# Patient Record
Sex: Male | Born: 1970 | Race: White | Hispanic: No | Marital: Married | Smoking: Never smoker
Health system: Southern US, Community
[De-identification: ages and names within clinical notes are randomized; demographics above are authoritative.]

## PROBLEM LIST (undated history)

## (undated) DIAGNOSIS — M109 Gout, unspecified: Secondary | ICD-10-CM

## (undated) HISTORY — PX: WRIST SURGERY: SHX841

---

## 2015-10-14 ENCOUNTER — Emergency Department (HOSPITAL_BASED_OUTPATIENT_CLINIC_OR_DEPARTMENT_OTHER)
Admission: EM | Admit: 2015-10-14 | Discharge: 2015-10-14 | Disposition: A | Discharge status: Home / Self Care | Payer: No Typology Code available for payment source | Attending: Emergency Medicine | Admitting: Emergency Medicine

## 2015-10-14 ENCOUNTER — Emergency Department (HOSPITAL_BASED_OUTPATIENT_CLINIC_OR_DEPARTMENT_OTHER): Payer: No Typology Code available for payment source

## 2015-10-14 ENCOUNTER — Encounter (HOSPITAL_BASED_OUTPATIENT_CLINIC_OR_DEPARTMENT_OTHER): Payer: Self-pay | Admitting: Emergency Medicine

## 2015-10-14 DIAGNOSIS — Y999 Unspecified external cause status: Secondary | ICD-10-CM | POA: Insufficient documentation

## 2015-10-14 DIAGNOSIS — M542 Cervicalgia: Secondary | ICD-10-CM | POA: Insufficient documentation

## 2015-10-14 DIAGNOSIS — M549 Dorsalgia, unspecified: Secondary | ICD-10-CM | POA: Diagnosis present

## 2015-10-14 DIAGNOSIS — Y9389 Activity, other specified: Secondary | ICD-10-CM | POA: Diagnosis not present

## 2015-10-14 DIAGNOSIS — Y92410 Unspecified street and highway as the place of occurrence of the external cause: Secondary | ICD-10-CM | POA: Diagnosis not present

## 2015-10-14 MED ORDER — IBUPROFEN 400 MG PO TABS
400.0000 mg | ORAL_TABLET | Freq: Once | ORAL | Status: AC
  Administered 2015-10-14: 400 mg via ORAL
  Filled 2015-10-14: qty 1

## 2015-10-14 MED ORDER — CYCLOBENZAPRINE HCL 7.5 MG PO TABS: 7.5000 mg | ORAL_TABLET | Freq: Three times a day (TID) | ORAL | 0 refills | Status: AC | PRN

## 2015-10-14 NOTE — ED Notes (Signed)
Patient transported to X-ray 
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
## 2015-10-14 NOTE — Discharge Instructions (Signed)
Begin taking ibuprofen 600-800 mg every 6 hours for the next 5 days, whether you are having pain or not.  You can also take Flexeril up to every 8 hours as needed. This medication may make you sleepy.  You can also use heating pads or ice packs on any painful areas.   You pain will probably get worse over the next 2 days, but should begin to improve after that.  It is important to stay active, as this will help your pain go away faster.
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
## 2016-06-03 ENCOUNTER — Ambulatory Visit
Admission: RE | Admit: 2016-06-03 | Discharge: 2016-06-03 | Disposition: A | Discharge status: Home / Self Care | Payer: BLUE CROSS/BLUE SHIELD | Source: Ambulatory Visit | Attending: Allergy | Admitting: Allergy

## 2016-06-03 ENCOUNTER — Other Ambulatory Visit: Payer: Self-pay | Admitting: Allergy

## 2016-06-03 DIAGNOSIS — R059 Cough, unspecified: Secondary | ICD-10-CM

## 2016-06-03 DIAGNOSIS — R05 Cough: Secondary | ICD-10-CM

## 2016-06-08 ENCOUNTER — Other Ambulatory Visit: Payer: Self-pay | Admitting: Acute Care
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
## 2018-03-20 IMAGING — DX DG CHEST 2V
2 series · 2 of 2 positions shown · non-contrast
Comparison: None.

CLINICAL DATA: Prolonged cough and shortness of breath for 1 month.

EXAM:
CHEST  2 VIEW

[dg chest 2 view (1 of 2)]
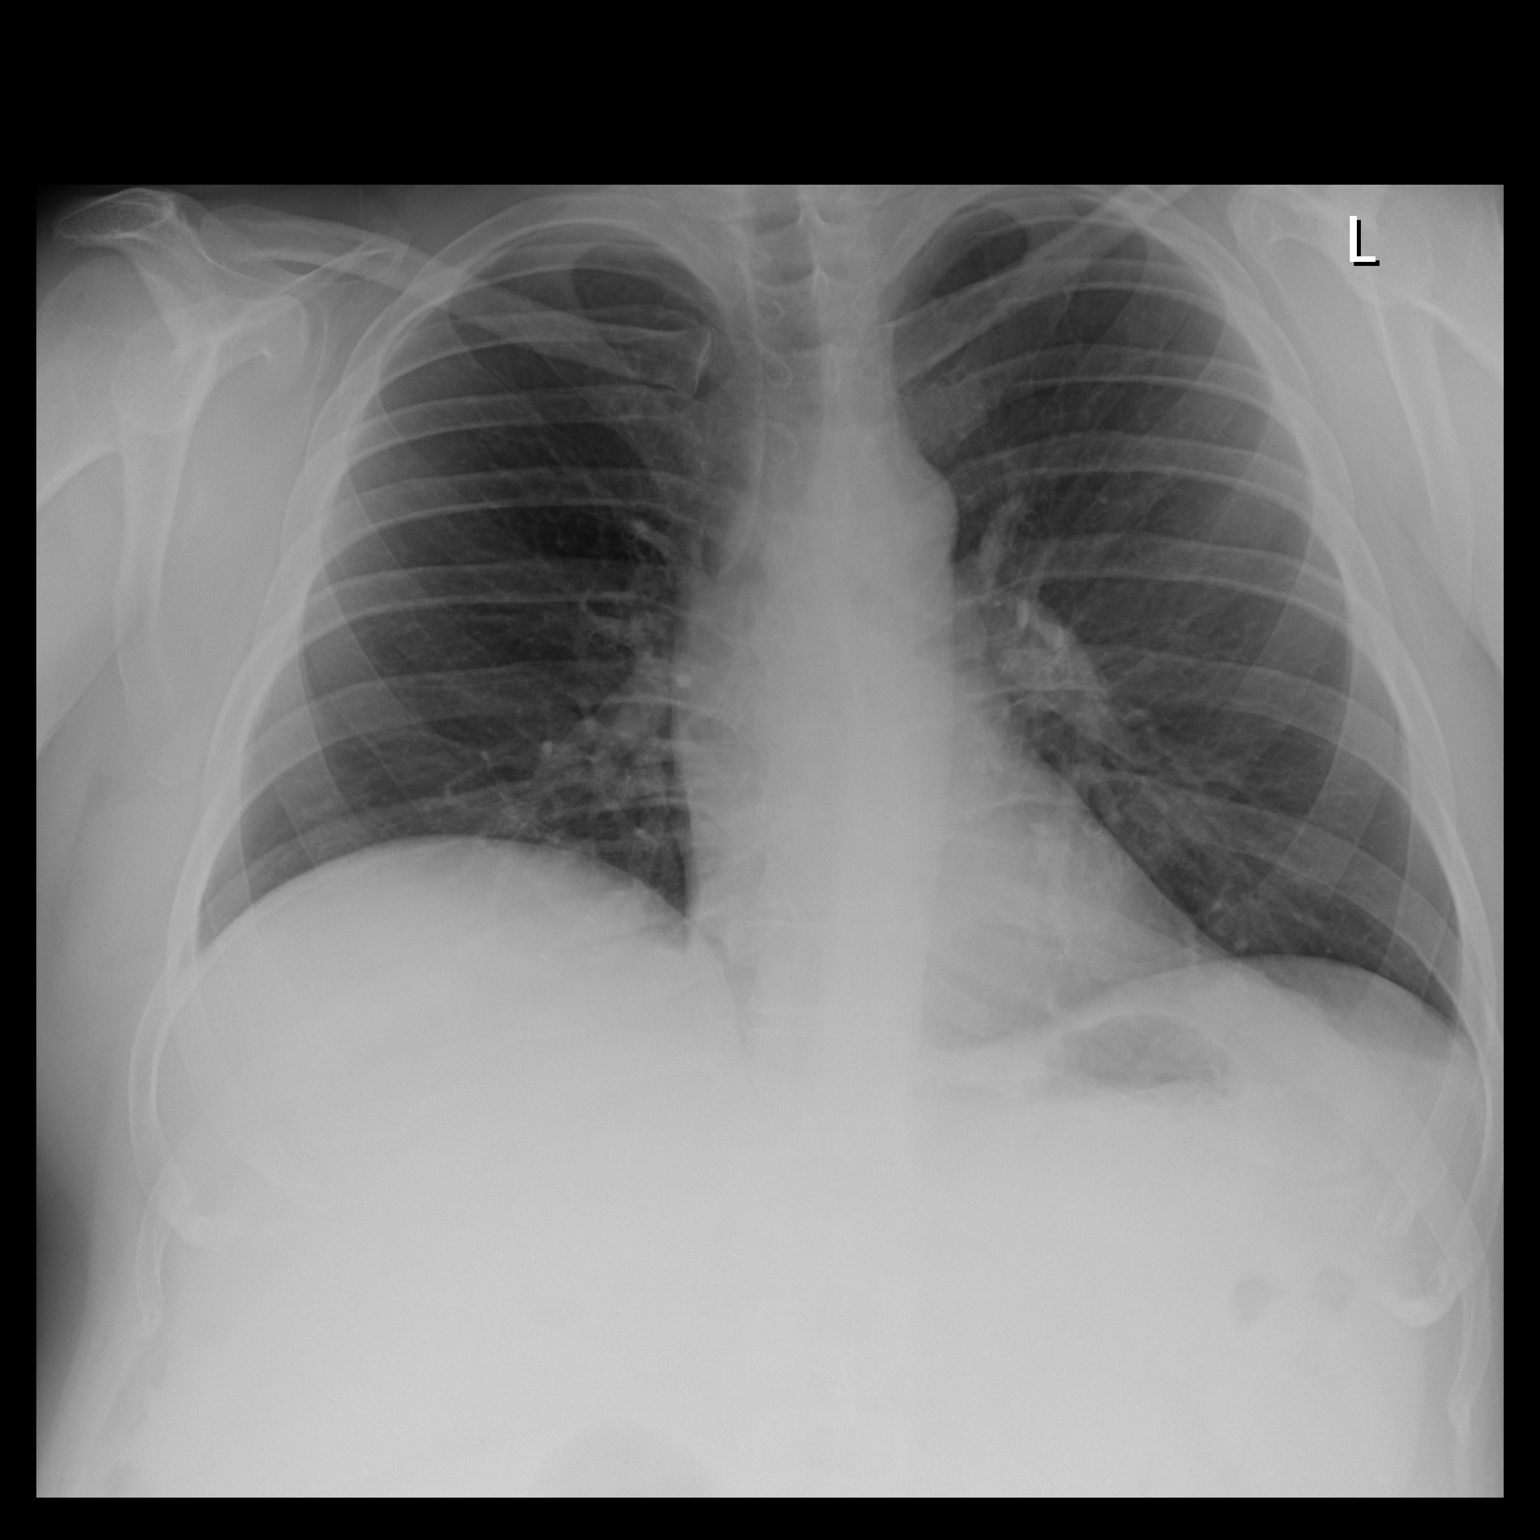

[dg chest 2 view (2 of 2)]
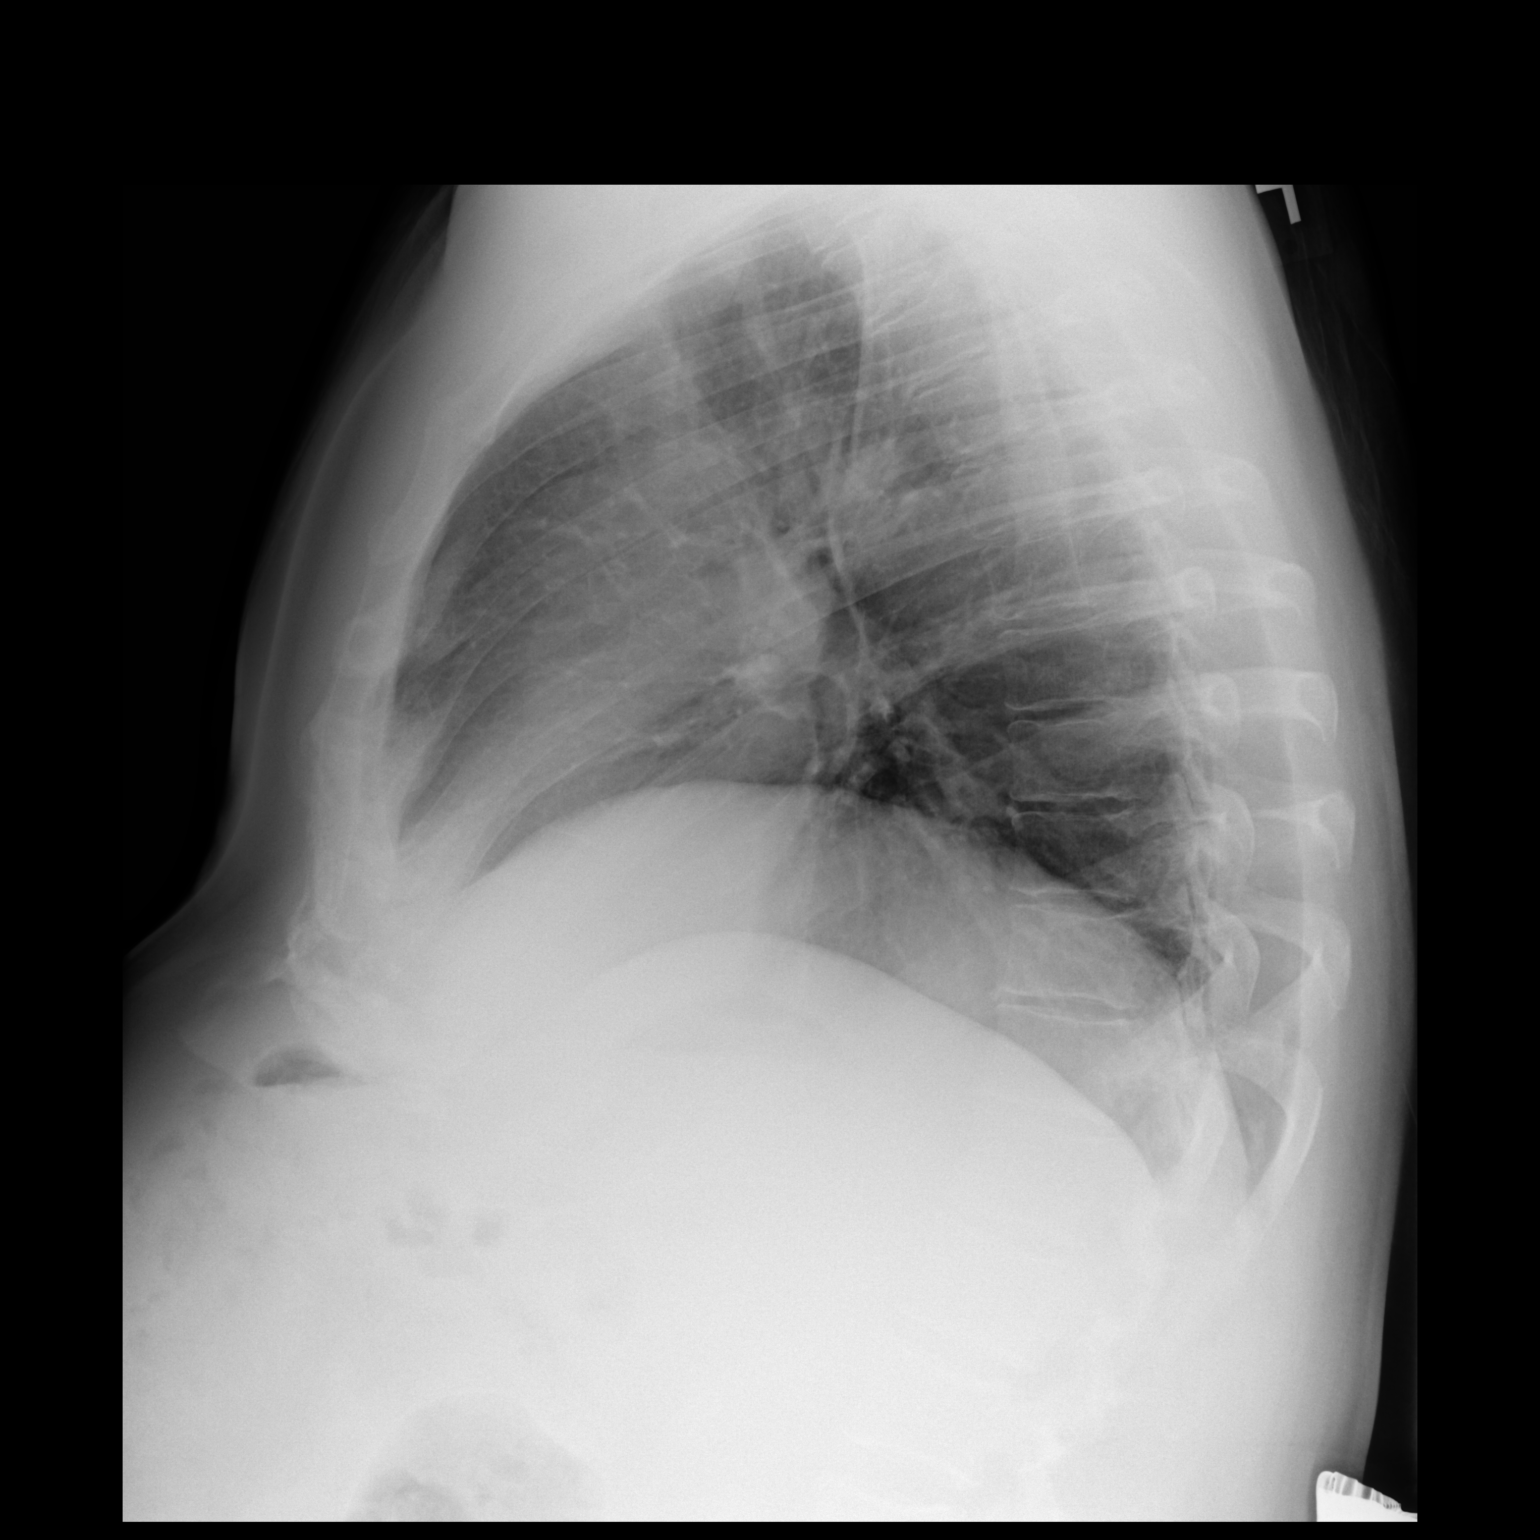

[2 of 2 positions shown; findings below may reference images not displayed]

FINDINGS: The heart size and mediastinal contours are within normal limits.
Both lungs are clear. The visualized skeletal structures are
unremarkable.
IMPRESSION: No active cardiopulmonary disease.

## 2019-02-14 ENCOUNTER — Ambulatory Visit: Payer: BLUE CROSS/BLUE SHIELD | Admitting: Allergy and Immunology

## 2020-09-13 ENCOUNTER — Encounter (HOSPITAL_BASED_OUTPATIENT_CLINIC_OR_DEPARTMENT_OTHER): Payer: Self-pay | Admitting: Emergency Medicine

## 2020-09-13 ENCOUNTER — Other Ambulatory Visit: Payer: Self-pay

## 2020-09-13 ENCOUNTER — Emergency Department (HOSPITAL_BASED_OUTPATIENT_CLINIC_OR_DEPARTMENT_OTHER)
Admission: EM | Admit: 2020-09-13 | Discharge: 2020-09-13 | Disposition: A | Discharge status: Home / Self Care | Payer: BC Managed Care – PPO | Attending: Emergency Medicine | Admitting: Emergency Medicine

## 2020-09-13 ENCOUNTER — Emergency Department (HOSPITAL_BASED_OUTPATIENT_CLINIC_OR_DEPARTMENT_OTHER): Payer: BC Managed Care – PPO

## 2020-09-13 DIAGNOSIS — M25512 Pain in left shoulder: Secondary | ICD-10-CM | POA: Diagnosis not present

## 2020-09-13 DIAGNOSIS — R030 Elevated blood-pressure reading, without diagnosis of hypertension: Secondary | ICD-10-CM

## 2020-09-13 HISTORY — DX: Gout, unspecified: M10.9

## 2020-09-13 MED ORDER — METHOCARBAMOL 750 MG PO TABS: 750.0000 mg | ORAL_TABLET | Freq: Three times a day (TID) | ORAL | 0 refills | Status: AC | PRN

## 2020-09-13 MED ORDER — ACETAMINOPHEN 500 MG PO TABS
1000.0000 mg | ORAL_TABLET | Freq: Once | ORAL | Status: AC
  Administered 2020-09-13: 1000 mg via ORAL
  Filled 2020-09-13: qty 2

## 2020-09-13 MED ORDER — IBUPROFEN 600 MG PO TABS: 600.0000 mg | ORAL_TABLET | Freq: Three times a day (TID) | ORAL | 0 refills | Status: AC | PRN

## 2020-09-13 MED ORDER — METHOCARBAMOL 500 MG PO TABS
750.0000 mg | ORAL_TABLET | Freq: Once | ORAL | Status: AC
  Administered 2020-09-13: 750 mg via ORAL
  Filled 2020-09-13: qty 2

## 2020-09-13 MED ORDER — TRAMADOL HCL 50 MG PO TABS
50.0000 mg | ORAL_TABLET | Freq: Once | ORAL | Status: AC
  Administered 2020-09-13: 50 mg via ORAL
  Filled 2020-09-13: qty 1

## 2020-09-13 MED ORDER — KETOROLAC TROMETHAMINE 30 MG/ML IJ SOLN
30.0000 mg | Freq: Once | INTRAMUSCULAR | Status: AC
  Administered 2020-09-13: 30 mg via INTRAMUSCULAR
  Filled 2020-09-13: qty 1

## 2020-09-13 NOTE — Discharge Instructions (Addendum)
It was our pleasure to provide your ER care today - we hope that you feel better.  Icepack/cold to sore area.   Take ibuprofen and/or acetaminophen as need for pain. You may also try robaxin as need for muscle pain/spasm - no driving when taking.   Follow up with orthopedist in the next 1-2 weeks if symptoms fail to improve/resolve - call office tomorrow to arrange appointment.   Your blood pressure is high today - follow up with primary care doctor in the next 1-2  weeks.   Return to ER if worse, new symptoms, fevers, redness, severe swelling, numbness/weakness, chest pain, trouble breathing, or other concern.   You were given pain medication in the ER - no driving for the next 6 hours.

## 2020-09-13 NOTE — ED Provider Notes (Addendum)
[facility] EMERGENCY DEPT Provider Note   CSN: [id number] Arrival date & time: 09/13/20  8563     History Chief Complaint  Patient presents with   Arm Pain    Ricardo Pennington is a 50 y.o. male.  Patient c/o left shoulder pain in the past 8-9 days. Symptoms acute onset, moderate, constant, persistent, dull, worse w movement of shoulder, occasionally radiates down arm. No associated numbness, weakness, or loss of normal function or arm. No neck pain or radicular pain. No arm swelling. No chest pain or discomfort of any sort. No unusual doe or sob. No fever or chills. Took bc powder without relief. Pt denies specific strain or injury of shoulder.  The history is provided by the patient and medical records.  Arm Pain Pertinent negatives include no chest pain, no abdominal pain and no shortness of breath.      Past Medical History:  Diagnosis Date   Gout attack     There are no problems to display for this patient.   Past Surgical History:  Procedure Laterality Date   WRIST SURGERY Right        No family history on file.  Social History   Tobacco Use   Smoking status: Never   Smokeless tobacco: Never  Vaping Use   Vaping Use: Never used  Substance Use Topics   Alcohol use: Yes   Drug use: Never    Home Medications Prior to Admission medications   Medication Sig Start Date End Date Taking? Authorizing Provider  cyclobenzaprine (FEXMID) 7.5 MG tablet Take 1 tablet (7.5 mg total) by mouth 3 (three) times daily as needed for muscle spasms. 10/14/15   Marquette Saa, MD    Allergies    Patient has no known allergies.  Review of Systems   Review of Systems  Constitutional:  Negative for fever.  HENT:  Negative for sore throat.   Eyes:  Negative for redness.  Respiratory:  Negative for shortness of breath.   Cardiovascular:  Negative for chest pain.  Gastrointestinal:  Negative for abdominal pain.  Genitourinary:  Negative for flank pain.   Musculoskeletal:  Negative for back pain and neck pain.  Skin:  Negative for rash.  Neurological:  Negative for weakness and numbness.  Hematological:  Does not bruise/bleed easily.  Psychiatric/Behavioral:  Negative for confusion.    Physical Exam Updated Vital Signs BP (!) 161/99 (BP Location: Right Arm)   Pulse (!) 59   Temp 98.2 F (36.8 C) (Oral)   Resp 16   Ht 1.676 m (5\' 6" )   Wt 95.3 kg   SpO2 98%   BMI 33.89 kg/m   Physical Exam Vitals and nursing note reviewed.  Constitutional:      Appearance: Normal appearance. He is well-developed.  HENT:     Head: Atraumatic.     Nose: Nose normal.     Mouth/Throat:     Mouth: Mucous membranes are moist.     Pharynx: Oropharynx is clear.  Eyes:     General: No scleral icterus.    Conjunctiva/sclera: Conjunctivae normal.  Neck:     Trachea: No tracheal deviation.  Cardiovascular:     Rate and Rhythm: Normal rate and regular rhythm.     Pulses: Normal pulses.     Heart sounds: Normal heart sounds. No murmur heard.   No friction rub. No gallop.  Pulmonary:     Effort: Pulmonary effort is normal. No accessory muscle usage or respiratory distress.  Breath sounds: Normal breath sounds.  Abdominal:     General: Bowel sounds are normal. There is no distension.     Palpations: Abdomen is soft.     Tenderness: There is no abdominal tenderness.  Genitourinary:    Comments: No cva tenderness. Musculoskeletal:        General: No swelling.     Cervical back: Normal range of motion and neck supple. No rigidity.     Comments: Tenderness left shoulder anteriorly, esp in ac region, reproducing patients pain. Pain w active rom shoulder. No pain w slow/passive rom. No LUE swelling. Radial pulse 2+. C/T spine non tender, aligned. No pain w rom neck.   Skin:    General: Skin is warm and dry.     Findings: No rash.  Neurological:     Mental Status: He is alert.     Comments: Alert, speech clear. LUE motor/sens grossly intact. L  rad/med/uln fxn, motor and sens, intact.   Psychiatric:        Mood and Affect: Mood normal.    ED Results / Procedures / Treatments   Labs (all labs ordered are listed, but only abnormal results are displayed) Labs Reviewed - No data to display  EKG EKG Interpretation  Date/Time:  Sunday September 13 2020 10:15:19 EDT Ventricular Rate:  62 PR Interval:  148 QRS Duration: 95 QT Interval:  419 QTC Calculation: 426 R Axis:   -33 Text Interpretation: Sinus rhythm Left axis deviation No previous tracing Confirmed by Cathren Laine ([id number]) on 09/13/2020 10:20:26 AM  Radiology DG Shoulder Left  Result Date: 09/13/2020 CLINICAL DATA:  Left shoulder and arm pain. EXAM: LEFT SHOULDER - 2+ VIEW COMPARISON:  None. FINDINGS: There is no evidence of fracture or dislocation. There is no evidence of arthropathy or other focal bone abnormality. Soft tissues are unremarkable. IMPRESSION: Negative. Electronically Signed   By: Emmaline Kluver M.D.   On: 09/13/2020 11:20    Procedures Procedures   Medications Ordered in ED Medications  acetaminophen (TYLENOL) tablet 1,000 mg (has no administration in time range)  traMADol (ULTRAM) tablet 50 mg (has no administration in time range)    ED Course  I have reviewed the triage vital signs and the nursing notes.  Pertinent labs & imaging results that were available during my care of the patient were reviewed by me and considered in my medical decision making (see chart for details).    MDM Rules/Calculators/A&P                          Imaging ordered. Pt has ride, does not have to drive. Ultram po, acetaminophen po.   Reviewed nursing notes and prior charts for additional history.  Pt noted with prior evals for left shoulder pain ~ 8-9 months ago.   Xrays reviewed/interpreted by me - no fx.   Pain reproduced on exam, and improved w meds.  Pt requests additional med. Toradol 30 mg im.   Pt currently appears stable for d/c.  Rec ortho f/u. Pcp  f/u re bp.   Return precautions provided.      Final Clinical Impression(s) / ED Diagnoses Final diagnoses:  None    Rx / DC Orders ED Discharge Orders     None         Cathren Laine, MD 09/13/20 1135
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
## 2022-08-08 ENCOUNTER — Ambulatory Visit (INDEPENDENT_AMBULATORY_CARE_PROVIDER_SITE_OTHER): Payer: BC Managed Care – PPO | Admitting: Sports Medicine

## 2022-08-08 ENCOUNTER — Other Ambulatory Visit (INDEPENDENT_AMBULATORY_CARE_PROVIDER_SITE_OTHER): Payer: BC Managed Care – PPO

## 2022-08-08 ENCOUNTER — Encounter: Payer: Self-pay | Admitting: Sports Medicine

## 2022-08-08 DIAGNOSIS — M79605 Pain in left leg: Secondary | ICD-10-CM | POA: Diagnosis not present

## 2022-08-08 DIAGNOSIS — S8012XA Contusion of left lower leg, initial encounter: Secondary | ICD-10-CM

## 2022-08-08 NOTE — Progress Notes (Signed)
Larey Seat off a trailer a few months ago Injured left leg/shin area Has a scar/indentation from the injury that is painful to touch Was told his xrays done at [facility] "looked funny"  Denies any medication for pain
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
